# Patient Record
Sex: Female | Born: 1995 | Race: White | Hispanic: No | Marital: Single | Smoking: Never smoker
Health system: Southern US, Community
[De-identification: ages and names within clinical notes are randomized; demographics above are authoritative.]

## PROBLEM LIST (undated history)

## (undated) DIAGNOSIS — I493 Ventricular premature depolarization: Secondary | ICD-10-CM

## (undated) DIAGNOSIS — O139 Gestational [pregnancy-induced] hypertension without significant proteinuria, unspecified trimester: Secondary | ICD-10-CM

## (undated) HISTORY — DX: Gestational (pregnancy-induced) hypertension without significant proteinuria, unspecified trimester: O13.9

## (undated) HISTORY — DX: Ventricular premature depolarization: I49.3

---
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
## 2016-04-13 IMAGING — US US TRANSVAGINAL NON-OB
1 series · 13 of 15 positions shown · non-contrast
Comparison: None.

CLINICAL DATA: Pelvic pain. LMP [DATE]. Clinical suspicion for
ovarian torsion.

EXAM:
TRANSABDOMINAL AND TRANSVAGINAL ULTRASOUND OF PELVIS
DOPPLER ULTRASOUND OF OVARIES
TECHNIQUE: Both transabdominal and transvaginal ultrasound examinations of the
pelvis were performed. Transabdominal technique was performed for
global imaging of the pelvis including uterus, ovaries, adnexal
regions, and pelvic cul-de-sac.
It was necessary to proceed with endovaginal exam following the
transabdominal exam to visualize the endometrium and ovaries. Color
and duplex Doppler ultrasound was utilized to evaluate blood flow to
the ovaries.

[Series 1: us transvaginal non-ob · 0.11mm/px · 13 of 15 slices shown]
[im 1/15]
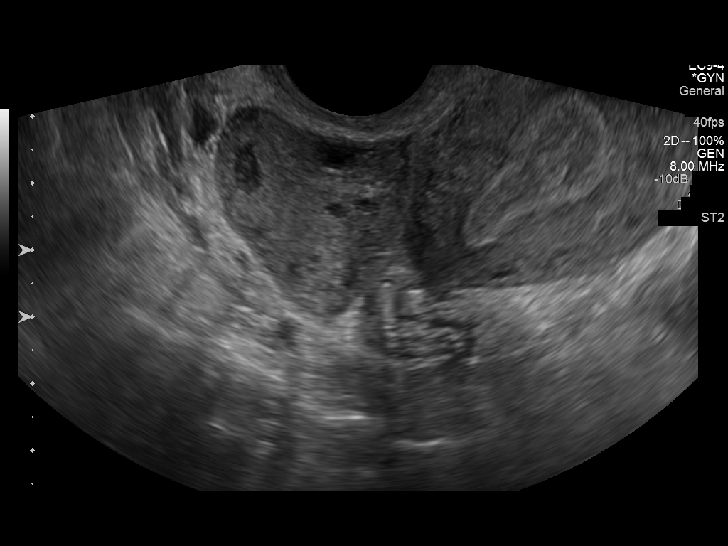
[im 2/15]
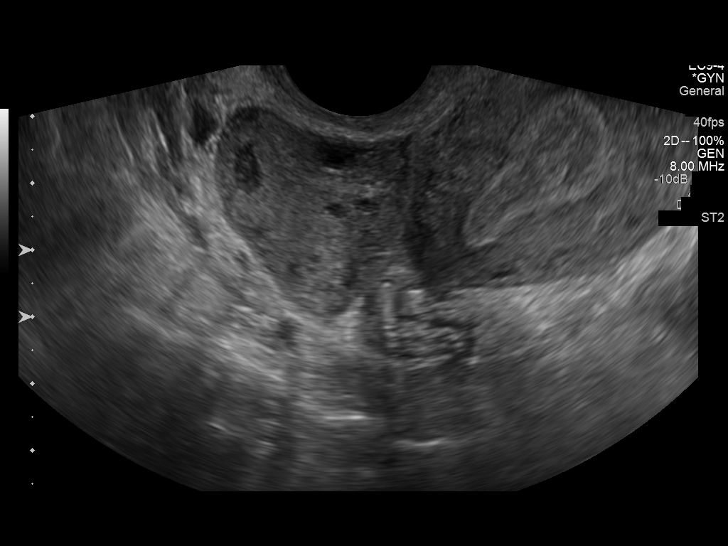
[im 3/15]
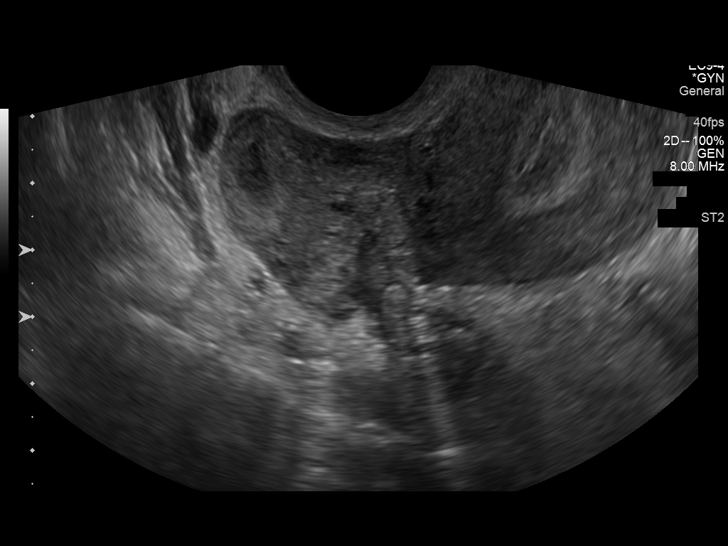
[im 5/15]
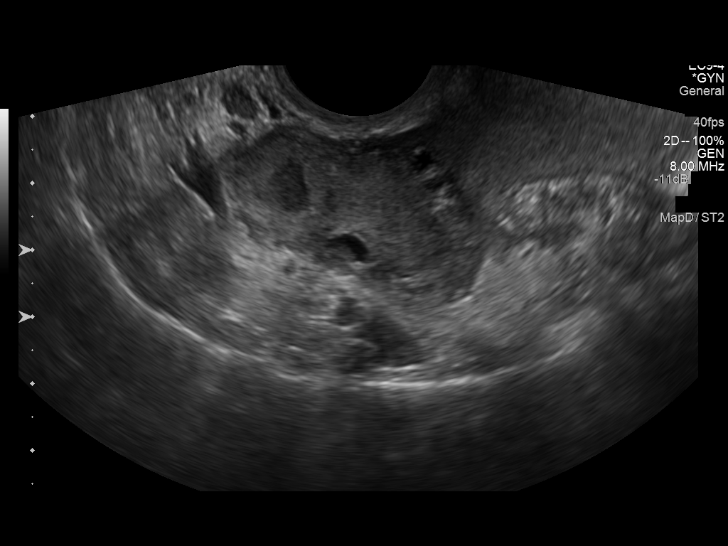
[im 6/15]
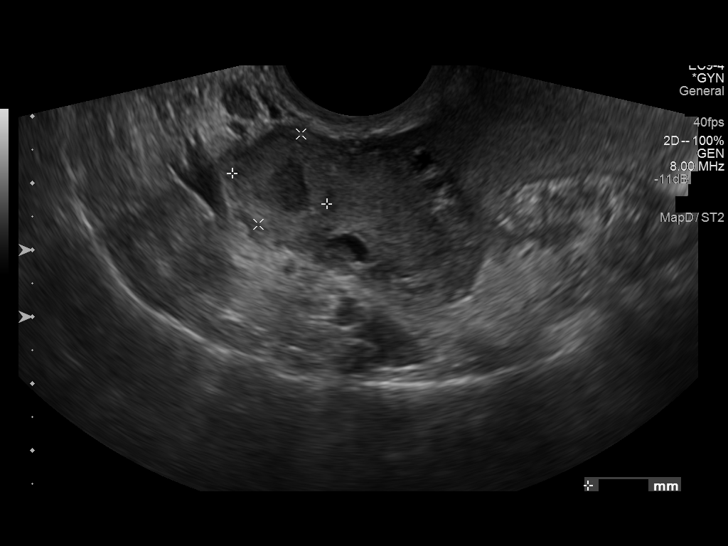
[im 7/15]
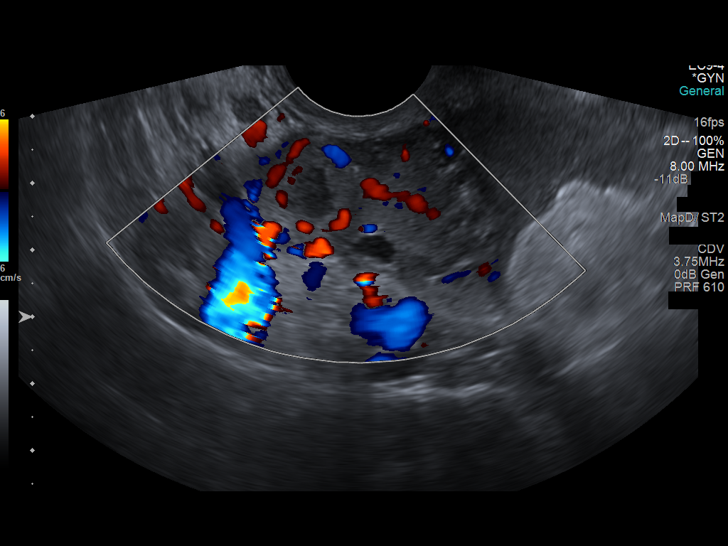
[im 8/15]
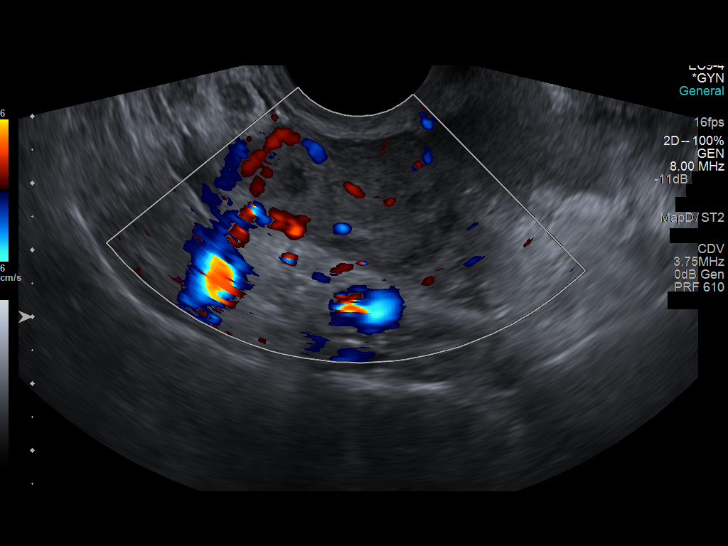
[im 9/15]
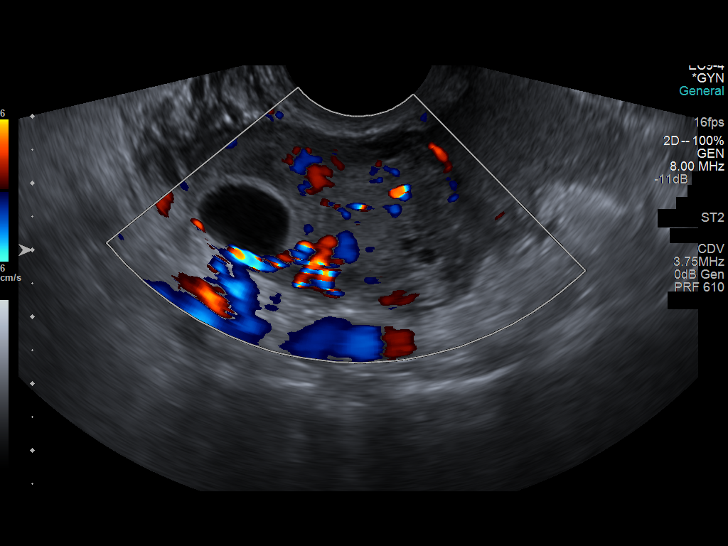
[im 10/15]
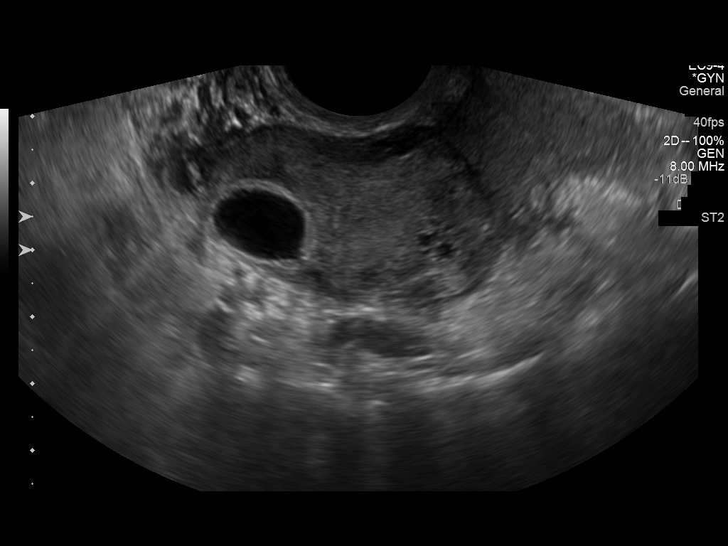
[im 11/15]
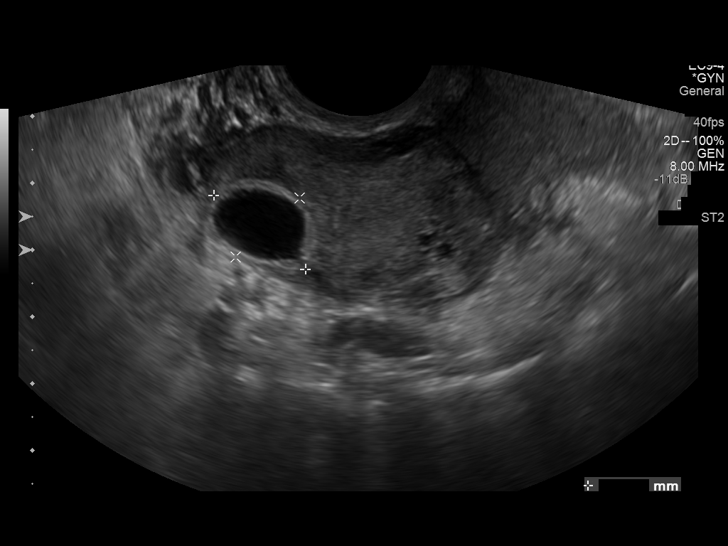
[im 13/15]
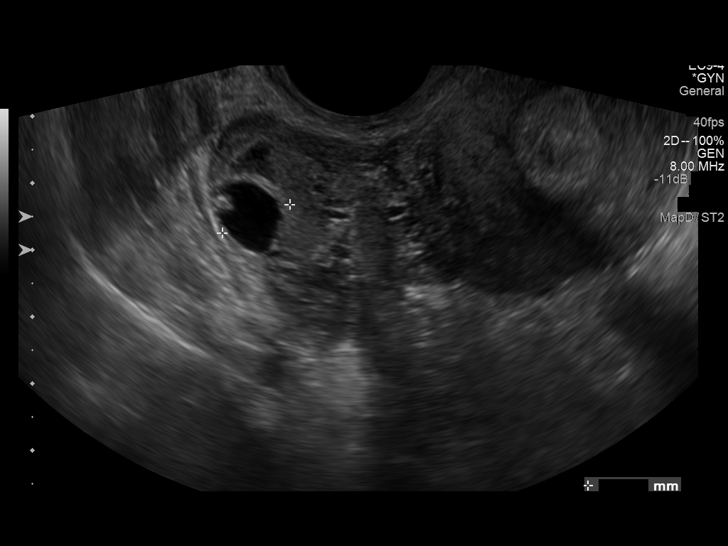
[im 14/15]
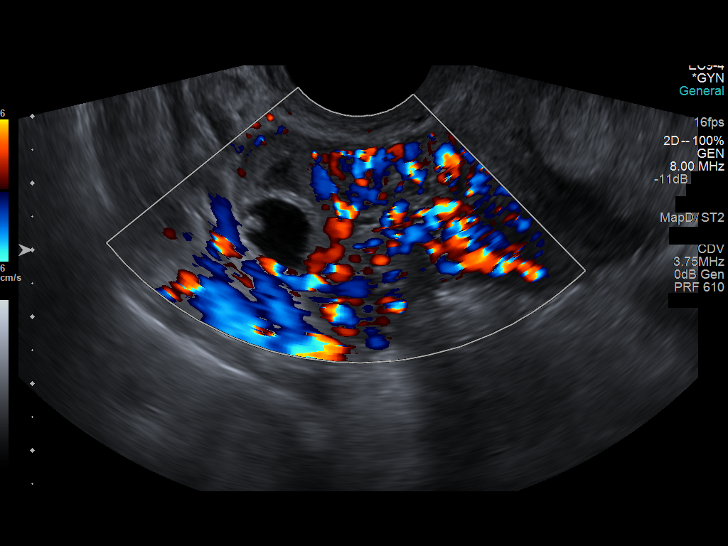
[im 15/15]
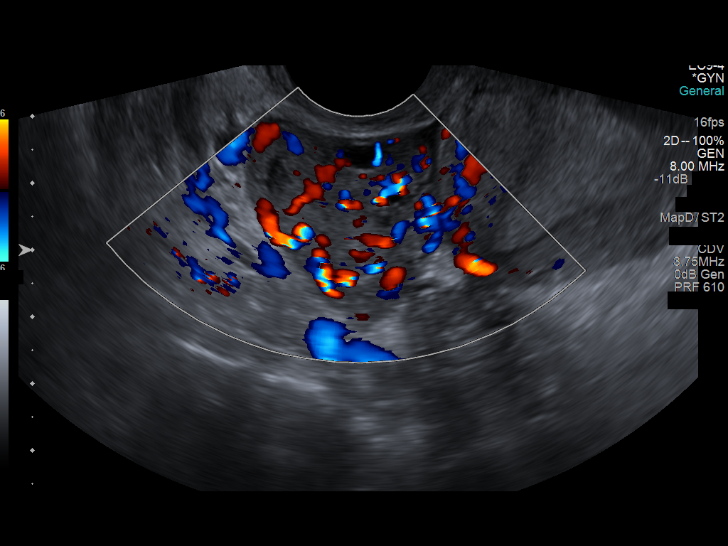

[13 of 15 positions shown; findings below may reference images not displayed]

FINDINGS: Uterus

Measurements: 6.8 x 3.8 x 4.8 cm. Retroflexed. No fibroids or other
mass visualized.

Endometrium

Thickness: 9 mm.  No focal abnormality visualized.

Right ovary

Measurements: 3.9 x 2.7 x 2.8 cm. Normal appearance/no adnexal mass.

Left ovary

Measurements: 2.5 x 2.0 x 2.0 cm. Normal appearance/no adnexal mass.

Pulsed Doppler evaluation of both ovaries demonstrates normal
low-resistance arterial and venous waveforms.

Other findings

Small amount of complex free fluid in pelvic cul-de-sac and both
adnexal regions.
IMPRESSION: Normal appearance of uterus and both ovaries. No pelvic mass
visualized.

Small amount of complex free fluid in pelvic cul-de-sac and both
adnexal regions, which is nonspecific.

No sonographic evidence for ovarian torsion.

## 2016-05-01 IMAGING — US US ABDOMEN COMPLETE
1 series · 14 of 25 positions shown · non-contrast
Comparison: 05/12/2013 CT.

CLINICAL DATA: Elevated liver function tests. Right-sided shoulder
pain. Appendectomy on 05/12/2013.

EXAM:
ULTRASOUND ABDOMEN COMPLETE

[Series 1: us abdomen complete · 0.43mm/px · 14 of 71 slices shown]
[im 1/71]
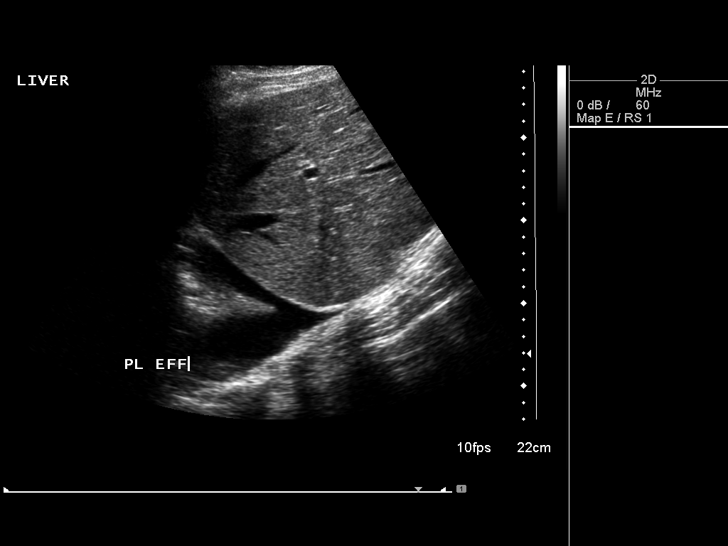
[im 6/71]
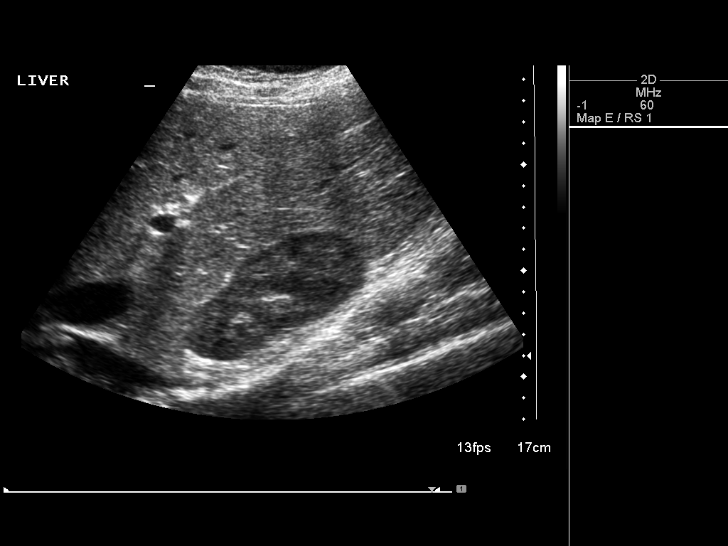
[im 12/71]
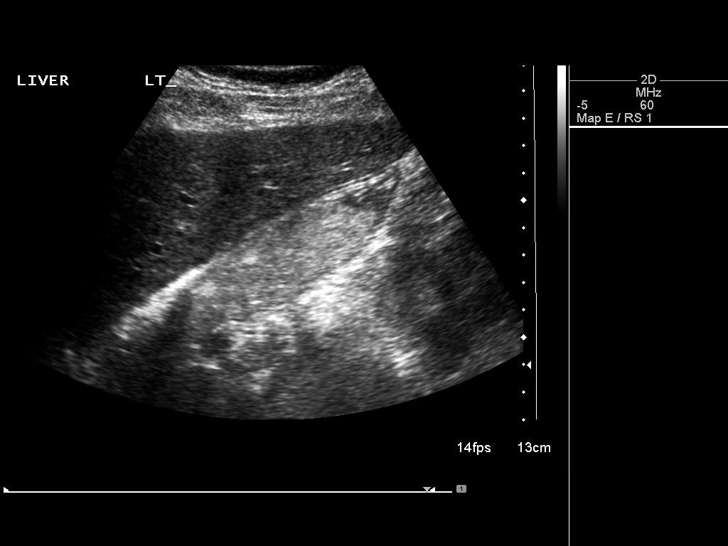
[im 18/71]
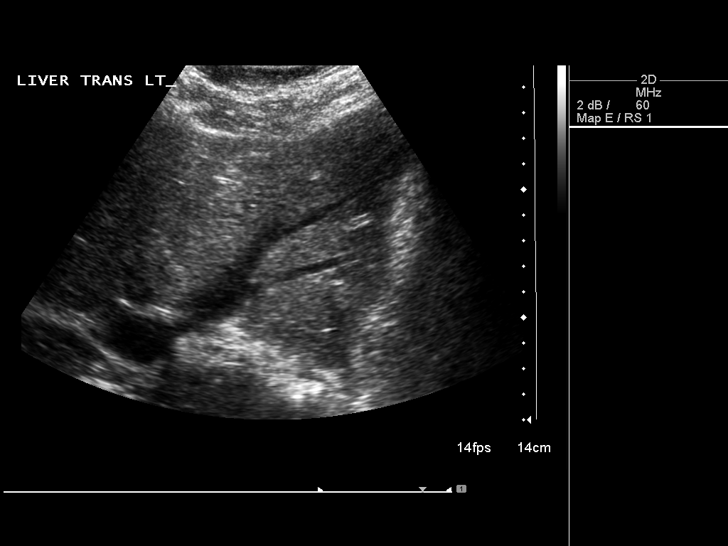
[im 24/71]
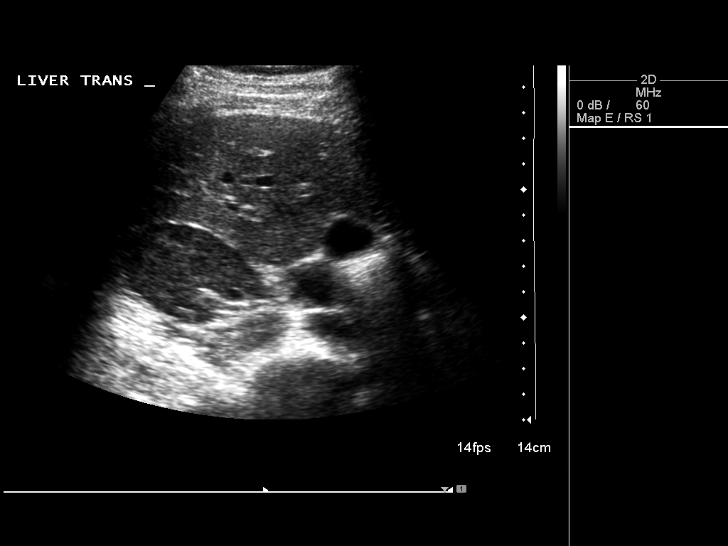
[im 27/71]
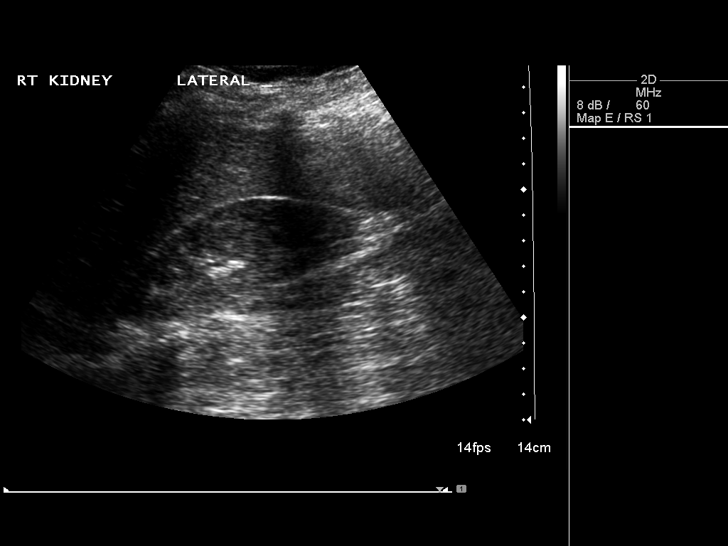
[im 33/71]
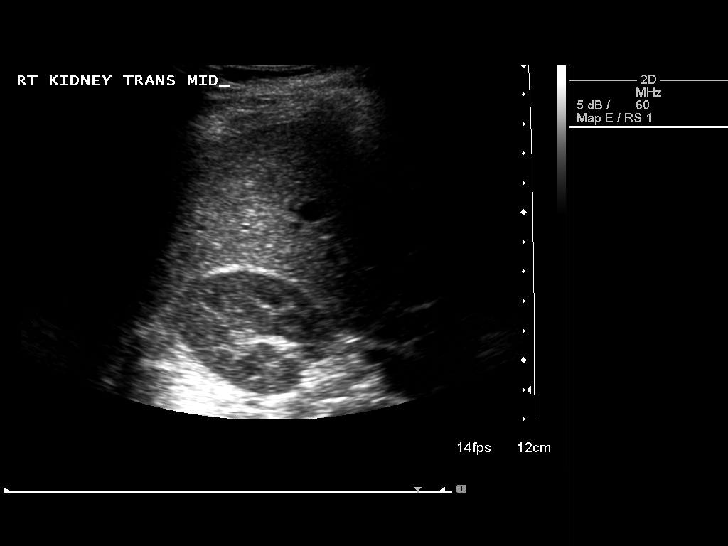
[im 38/71]
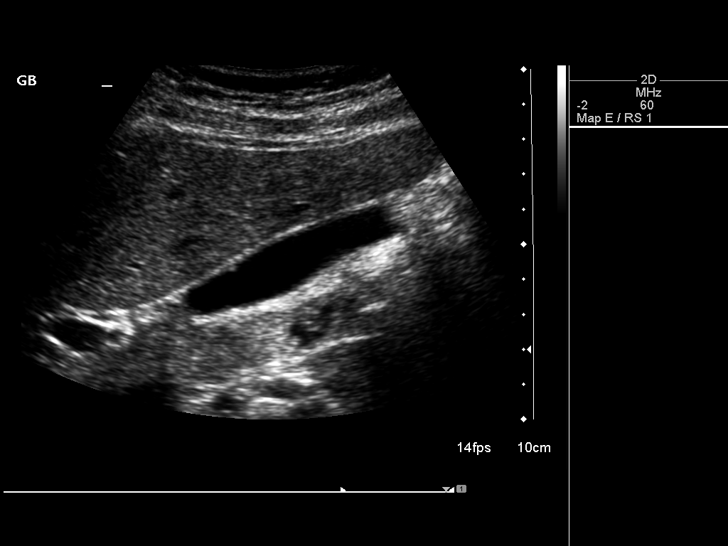
[im 44/71]
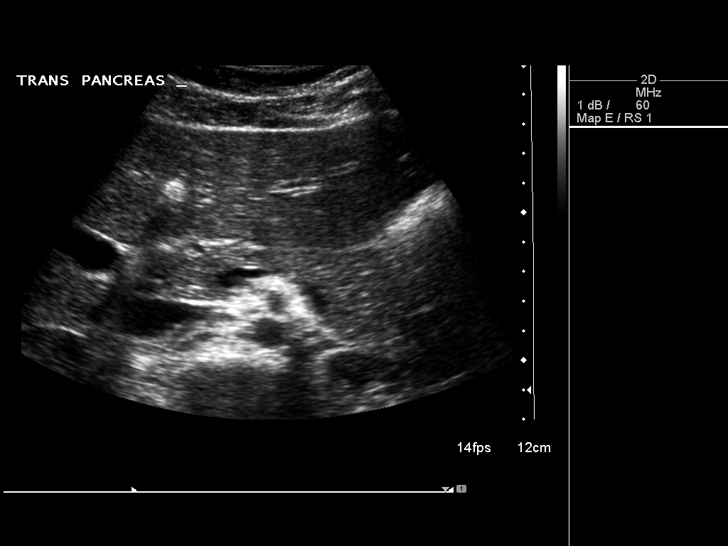
[im 47/71]
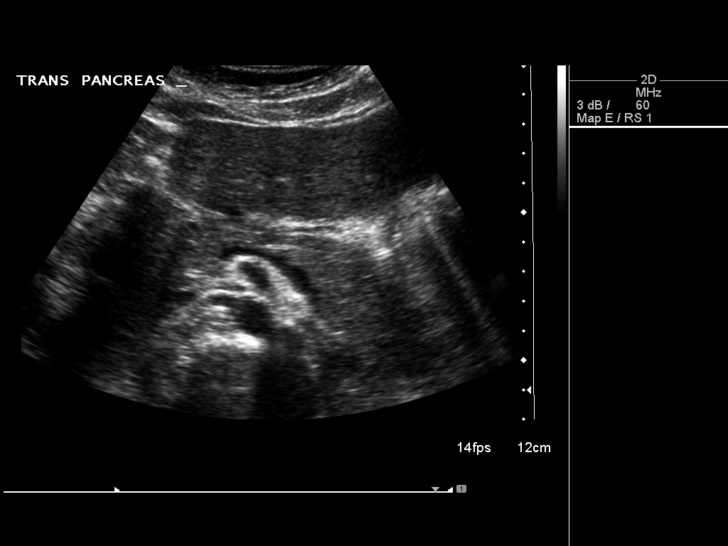
[im 53/71]
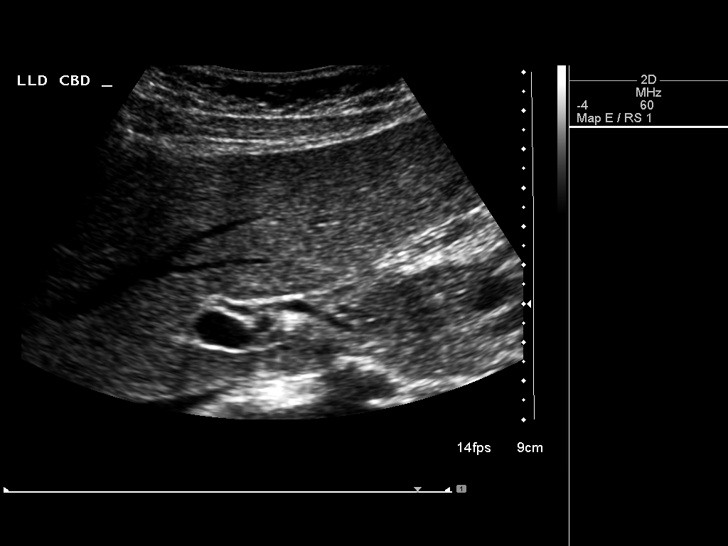
[im 59/71]
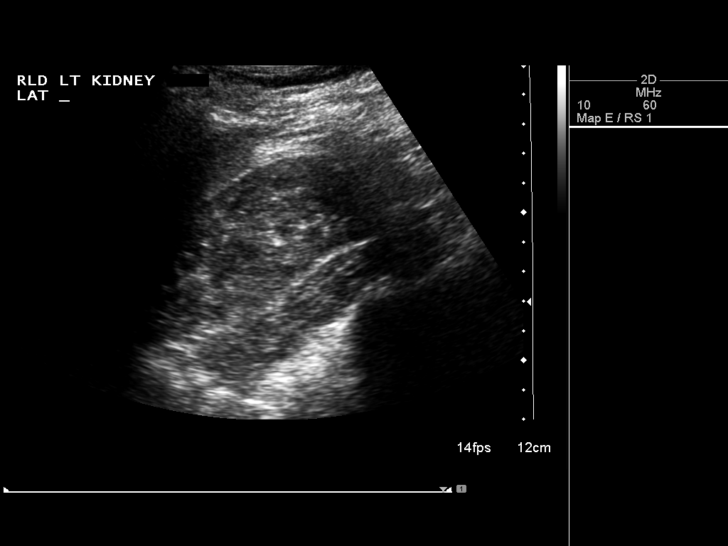
[im 65/71]
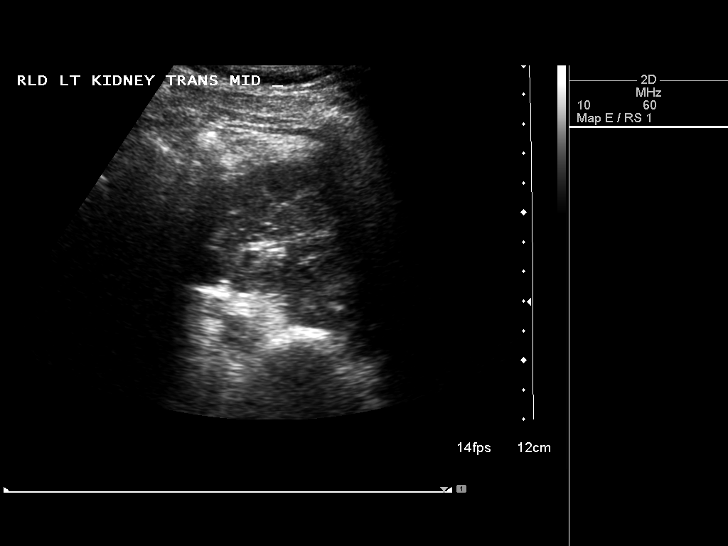
[im 71/71]
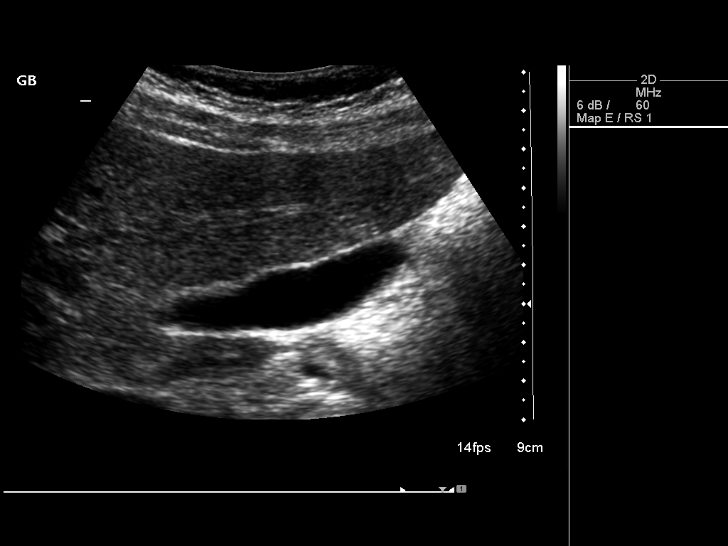

[14 of 25 positions shown; findings below may reference images not displayed]

FINDINGS: Gallbladder:

Possible 2 mm gallbladder polyp on image 39. Of doubtful clinical
significance. No wall thickening or pericholecystic fluid.
Sonographic Murphy's sign was not elicited.

Common bile duct:

Diameter: Normal, 2 mm.

Liver:

No focal lesion identified. Within normal limits in parenchymal
echogenicity.

IVC:

No abnormality visualized.

Pancreas:

Visualized portion unremarkable.

Spleen:

Size and appearance within normal limits.

Right Kidney:

Length: 10.8 cm. Echogenicity within normal limits. No mass or
hydronephrosis visualized.

Left Kidney:

Length: 11.4 cm. Echogenicity within normal limits. No mass or
hydronephrosis visualized.

Abdominal aorta:

No aneurysm visualized.

Other findings:

No ascites.  Small right-sided pleural effusion.
IMPRESSION: 1. No explanation for elevated liver function tests.
2. Small right-sided pleural effusion. Consider chest radiograph
correlation.

## 2022-01-05 NOTE — L&D Delivery Note (Signed)
DELIVERY NOTE  Pt complete and at +2 station with urge to push. Epidural controlling pain. Pt pushed and delivered a viable female infant in LOA position. Anterior and posterior shoulders spontaneously delivered with next two pushes; body easily followed next. Infant placed on mothers abdomen and bulb suction of mouth and nose performed. Cord was then clamped and cut by FOB. Cord blood obtained, 3VC. Baby had a vigorous spontaneous cry noted. Placenta then delivered at 2156 intact. Fundal massage performed and pitocin per protocol. Fundus firm. Large EBL initially, likely 2/2 magnesium sulfate running. TXA and methergine ordered (normotensive). The following lacerations were noted: 2nd deg, left sulcal, left periclitoral. Repaired in routine fashion with 2-0 vicryl. I stepped out of room after 2nd deg completed to complete c-section. Returned to complete sulcal and periclitoral repairs afterwards. Mother and baby stable. Counts correct EBL 800cc.  Infant time: 2154 Gender: female Placenta time: 2156 Apgars: 9/9 Weight: pending skin-to-skin

## 2022-02-19 LAB — OB RESULTS CONSOLE RUBELLA ANTIBODY, IGM: Rubella: NON-IMMUNE/NOT IMMUNE

## 2022-02-19 LAB — OB RESULTS CONSOLE HIV ANTIBODY (ROUTINE TESTING): HIV: NONREACTIVE

## 2022-02-19 LAB — OB RESULTS CONSOLE ANTIBODY SCREEN: Antibody Screen: NEGATIVE

## 2022-02-19 LAB — OB RESULTS CONSOLE RPR: RPR: NONREACTIVE

## 2022-02-19 LAB — OB RESULTS CONSOLE ABO/RH: RH Type: POSITIVE

## 2022-02-19 LAB — OB RESULTS CONSOLE GC/CHLAMYDIA
Chlamydia: NEGATIVE
Neisseria Gonorrhea: NEGATIVE

## 2022-02-19 LAB — OB RESULTS CONSOLE HEPATITIS B SURFACE ANTIGEN: Hepatitis B Surface Ag: NEGATIVE

## 2022-02-19 LAB — HEPATITIS C ANTIBODY: HCV Ab: NEGATIVE

## 2022-09-03 LAB — OB RESULTS CONSOLE GBS: GBS: NEGATIVE

## 2022-09-14 ENCOUNTER — Telehealth (HOSPITAL_COMMUNITY): Payer: Self-pay | Admitting: *Deleted

## 2022-09-14 ENCOUNTER — Encounter (HOSPITAL_COMMUNITY): Payer: Self-pay | Admitting: *Deleted

## 2022-09-14 NOTE — Telephone Encounter (Signed)
Preadmission screen  

## 2022-09-15 ENCOUNTER — Inpatient Hospital Stay (HOSPITAL_COMMUNITY): Payer: BC Managed Care – PPO | Admitting: Anesthesiology

## 2022-09-15 ENCOUNTER — Other Ambulatory Visit: Payer: Self-pay

## 2022-09-15 ENCOUNTER — Inpatient Hospital Stay (HOSPITAL_COMMUNITY)
Admission: AD | Admit: 2022-09-15 | Discharge: 2022-09-17 | DRG: 806 | Disposition: A | Discharge status: Home / Self Care | Payer: BC Managed Care – PPO | Attending: Obstetrics and Gynecology | Admitting: Obstetrics and Gynecology

## 2022-09-15 ENCOUNTER — Encounter (HOSPITAL_COMMUNITY): Payer: Self-pay | Admitting: Obstetrics and Gynecology

## 2022-09-15 ENCOUNTER — Inpatient Hospital Stay (HOSPITAL_COMMUNITY): Payer: BC Managed Care – PPO

## 2022-09-15 DIAGNOSIS — O1092 Unspecified pre-existing hypertension complicating childbirth: Secondary | ICD-10-CM | POA: Diagnosis present

## 2022-09-15 DIAGNOSIS — O99284 Endocrine, nutritional and metabolic diseases complicating childbirth: Secondary | ICD-10-CM | POA: Diagnosis present

## 2022-09-15 DIAGNOSIS — E876 Hypokalemia: Secondary | ICD-10-CM | POA: Diagnosis present

## 2022-09-15 DIAGNOSIS — Z3A38 38 weeks gestation of pregnancy: Secondary | ICD-10-CM | POA: Diagnosis not present

## 2022-09-15 DIAGNOSIS — O9081 Anemia of the puerperium: Secondary | ICD-10-CM | POA: Diagnosis not present

## 2022-09-15 DIAGNOSIS — D62 Acute posthemorrhagic anemia: Secondary | ICD-10-CM | POA: Diagnosis not present

## 2022-09-15 DIAGNOSIS — O114 Pre-existing hypertension with pre-eclampsia, complicating childbirth: Principal | ICD-10-CM | POA: Diagnosis present

## 2022-09-15 DIAGNOSIS — O169 Unspecified maternal hypertension, unspecified trimester: Principal | ICD-10-CM | POA: Diagnosis present

## 2022-09-15 LAB — TYPE AND SCREEN
ABO/RH(D): AB POS
Antibody Screen: NEGATIVE

## 2022-09-15 LAB — COMPREHENSIVE METABOLIC PANEL
ALT: 12 U/L (ref 0–44)
AST: 21 U/L (ref 15–41)
Albumin: 2.6 g/dL — ABNORMAL LOW (ref 3.5–5.0)
Alkaline Phosphatase: 168 U/L — ABNORMAL HIGH (ref 38–126)
Anion gap: 11 (ref 5–15)
BUN: 5 mg/dL — ABNORMAL LOW (ref 6–20)
CO2: 22 mmol/L (ref 22–32)
Calcium: 9 mg/dL (ref 8.9–10.3)
Chloride: 101 mmol/L (ref 98–111)
Creatinine, Ser: 0.51 mg/dL (ref 0.44–1.00)
GFR, Estimated: >60 mL/min (ref >60)
Glucose, Bld: 76 mg/dL (ref 70–99)
Potassium: 2.8 mmol/L — ABNORMAL LOW (ref 3.5–5.1)
Sodium: 134 mmol/L — ABNORMAL LOW (ref 135–145)
Total Bilirubin: 0.3 mg/dL (ref 0.3–1.2)
Total Protein: 6.1 g/dL — ABNORMAL LOW (ref 6.5–8.1)

## 2022-09-15 LAB — PROTEIN / CREATININE RATIO, URINE
Creatinine, Urine: 79 mg/dL
Protein Creatinine Ratio: 0.19 mg/mg{creat} — ABNORMAL HIGH (ref 0.00–0.15)
Total Protein, Urine: 15 mg/dL

## 2022-09-15 LAB — CBC
HCT: 34.2 % — ABNORMAL LOW (ref 36.0–46.0)
Hemoglobin: 11.4 g/dL — ABNORMAL LOW (ref 12.0–15.0)
MCH: 29.9 pg (ref 26.0–34.0)
MCHC: 33.3 g/dL (ref 30.0–36.0)
MCV: 89.8 fL (ref 80.0–100.0)
Platelets: 322 10*3/uL (ref 150–400)
RBC: 3.81 MIL/uL — ABNORMAL LOW (ref 3.87–5.11)
RDW: 14.2 % (ref 11.5–15.5)
WBC: 11.4 10*3/uL — ABNORMAL HIGH (ref 4.0–10.5)
nRBC: 0 % (ref 0.0–0.2)

## 2022-09-15 LAB — PLATELET COUNT: Platelets: 317 10*3/uL (ref 150–400)

## 2022-09-15 LAB — RPR: RPR Ser Ql: NONREACTIVE

## 2022-09-15 MED ORDER — OXYTOCIN-SODIUM CHLORIDE 30-0.9 UT/500ML-% IV SOLN
1.0000 m[IU]/min | INTRAVENOUS | Status: DC
  Administered 2022-09-15: 2 m[IU]/min via INTRAVENOUS

## 2022-09-15 MED ORDER — OXYCODONE-ACETAMINOPHEN 5-325 MG PO TABS: 2.0000 | ORAL_TABLET | ORAL | Status: DC | PRN

## 2022-09-15 MED ORDER — LABETALOL HCL 5 MG/ML IV SOLN
40.0000 mg | INTRAVENOUS | Status: DC | PRN
  Administered 2022-09-15: 40 mg via INTRAVENOUS
  Filled 2022-09-15: qty 8

## 2022-09-15 MED ORDER — LACTATED RINGERS IV SOLN: 500.0000 mL | INTRAVENOUS | Status: DC | PRN

## 2022-09-15 MED ORDER — MAGNESIUM SULFATE 40 GM/1000ML IV SOLN: 2.0000 g/h | INTRAVENOUS | Status: DC

## 2022-09-15 MED ORDER — EPHEDRINE 5 MG/ML INJ: 10.0000 mg | INTRAVENOUS | Status: DC | PRN

## 2022-09-15 MED ORDER — HYDRALAZINE HCL 20 MG/ML IJ SOLN: 10.0000 mg | INTRAMUSCULAR | Status: DC | PRN

## 2022-09-15 MED ORDER — LABETALOL HCL 5 MG/ML IV SOLN
20.0000 mg | INTRAVENOUS | Status: DC | PRN
  Administered 2022-09-15: 20 mg via INTRAVENOUS

## 2022-09-15 MED ORDER — MAGNESIUM SULFATE 40 GM/1000ML IV SOLN
INTRAVENOUS | Status: AC
  Filled 2022-09-15: qty 1000

## 2022-09-15 MED ORDER — LABETALOL HCL 5 MG/ML IV SOLN: 20.0000 mg | INTRAVENOUS | Status: DC | PRN

## 2022-09-15 MED ORDER — NIFEDIPINE ER OSMOTIC RELEASE 30 MG PO TB24
30.0000 mg | ORAL_TABLET | Freq: Every day | ORAL | Status: DC
  Administered 2022-09-15 – 2022-09-17 (×3): 30 mg via ORAL
  Filled 2022-09-15 (×3): qty 1

## 2022-09-15 MED ORDER — LABETALOL HCL 5 MG/ML IV SOLN: 80.0000 mg | INTRAVENOUS | Status: DC | PRN

## 2022-09-15 MED ORDER — OXYTOCIN BOLUS FROM INFUSION: 333.0000 mL | Freq: Once | INTRAVENOUS | Status: DC

## 2022-09-15 MED ORDER — LACTATED RINGERS IV SOLN: INTRAVENOUS | Status: DC

## 2022-09-15 MED ORDER — PHENYLEPHRINE 80 MCG/ML (10ML) SYRINGE FOR IV PUSH (FOR BLOOD PRESSURE SUPPORT): 80.0000 ug | PREFILLED_SYRINGE | INTRAVENOUS | Status: DC | PRN

## 2022-09-15 MED ORDER — METHYLERGONOVINE MALEATE 0.2 MG/ML IJ SOLN
INTRAMUSCULAR | Status: AC
  Administered 2022-09-15: 0.2 mg
  Filled 2022-09-15: qty 1

## 2022-09-15 MED ORDER — ONDANSETRON HCL 4 MG/2ML IJ SOLN
4.0000 mg | Freq: Four times a day (QID) | INTRAMUSCULAR | Status: DC | PRN
  Administered 2022-09-15: 4 mg via INTRAVENOUS
  Filled 2022-09-15: qty 2

## 2022-09-15 MED ORDER — LABETALOL HCL 5 MG/ML IV SOLN: 40.0000 mg | INTRAVENOUS | Status: DC | PRN

## 2022-09-15 MED ORDER — LABETALOL HCL 5 MG/ML IV SOLN
INTRAVENOUS | Status: AC
  Filled 2022-09-15: qty 4

## 2022-09-15 MED ORDER — SOD CITRATE-CITRIC ACID 500-334 MG/5ML PO SOLN: 30.0000 mL | ORAL | Status: DC | PRN

## 2022-09-15 MED ORDER — TRANEXAMIC ACID-NACL 1000-0.7 MG/100ML-% IV SOLN
INTRAVENOUS | Status: AC
  Administered 2022-09-15: 1000 mg
  Filled 2022-09-15: qty 100

## 2022-09-15 MED ORDER — LACTATED RINGERS IV SOLN
500.0000 mL | Freq: Once | INTRAVENOUS | Status: AC
  Administered 2022-09-15: 500 mL via INTRAVENOUS

## 2022-09-15 MED ORDER — DIPHENHYDRAMINE HCL 50 MG/ML IJ SOLN: 12.5000 mg | INTRAMUSCULAR | Status: DC | PRN

## 2022-09-15 MED ORDER — TERBUTALINE SULFATE 1 MG/ML IJ SOLN: 0.2500 mg | Freq: Once | INTRAMUSCULAR | Status: DC | PRN

## 2022-09-15 MED ORDER — MISOPROSTOL 50MCG HALF TABLET
50.0000 ug | ORAL_TABLET | ORAL | Status: DC | PRN
  Administered 2022-09-15: 50 ug via ORAL
  Filled 2022-09-15: qty 1

## 2022-09-15 MED ORDER — OXYTOCIN-SODIUM CHLORIDE 30-0.9 UT/500ML-% IV SOLN
2.5000 [IU]/h | INTRAVENOUS | Status: DC
  Filled 2022-09-15 (×2): qty 500

## 2022-09-15 MED ORDER — OXYCODONE-ACETAMINOPHEN 5-325 MG PO TABS: 1.0000 | ORAL_TABLET | ORAL | Status: DC | PRN

## 2022-09-15 MED ORDER — LIDOCAINE HCL (PF) 1 % IJ SOLN
INTRAMUSCULAR | Status: DC | PRN
  Administered 2022-09-15: 11 mL via EPIDURAL

## 2022-09-15 MED ORDER — LIDOCAINE HCL (PF) 1 % IJ SOLN
30.0000 mL | INTRAMUSCULAR | Status: AC | PRN
  Administered 2022-09-15: 30 mL via SUBCUTANEOUS
  Filled 2022-09-15: qty 30

## 2022-09-15 MED ORDER — ACETAMINOPHEN 325 MG PO TABS
650.0000 mg | ORAL_TABLET | ORAL | Status: DC | PRN
  Administered 2022-09-15: 650 mg via ORAL
  Filled 2022-09-15: qty 2

## 2022-09-15 MED ORDER — FENTANYL-BUPIVACAINE-NACL 0.5-0.125-0.9 MG/250ML-% EP SOLN
12.0000 mL/h | EPIDURAL | Status: DC | PRN
  Administered 2022-09-15: 12 mL/h via EPIDURAL
  Filled 2022-09-15: qty 250

## 2022-09-15 MED ORDER — MAGNESIUM SULFATE BOLUS VIA INFUSION
4.0000 g | Freq: Once | INTRAVENOUS | Status: AC
  Administered 2022-09-15: 4 g via INTRAVENOUS
  Filled 2022-09-15: qty 1000

## 2022-09-15 NOTE — Anesthesia Procedure Notes (Signed)
Epidural Patient location during procedure: OB Start time: 09/15/2022 12:24 PM End time: 09/15/2022 12:37 PM  Staffing Anesthesiologist: Lowella Curb, MD Performed: anesthesiologist   Preanesthetic Checklist Completed: patient identified, IV checked, site marked, risks and benefits discussed, surgical consent, monitors and equipment checked, pre-op evaluation and timeout performed  Epidural Patient position: sitting Prep: ChloraPrep Patient monitoring: heart rate, cardiac monitor, continuous pulse ox and blood pressure Approach: midline Location: L2-L3 Injection technique: LOR saline  Needle:  Needle type: Tuohy  Needle gauge: 17 G Needle length: 9 cm Needle insertion depth: 6 cm Catheter type: closed end flexible Catheter size: 20 Guage Catheter at skin depth: 11 cm Test dose: negative  Assessment Events: blood not aspirated, injection not painful, no injection resistance, no paresthesia and negative IV test  Additional Notes Reason for block:procedure for pain

## 2022-09-15 NOTE — Progress Notes (Signed)
Patient required IV labetalol 20mg  at 1253 for SRBP. Severe ranges have returned requiring IV labetalol again, therefore meeting requirements for ChTN with SI PreE (worsening BP). Being magnesium sulfate for seizure ppx (4g bolus, 2g maintenance). Denies PreE symptoms at this time BP (!) 149/76   Pulse 86   Temp 98.3 F (36.8 C) (Oral)   Resp 19   Ht 5\' 6"  (1.676 m)   Wt 113.9 kg   SpO2 95%   BMI 40.51 kg/m  CE 7/90/-1 TOCO q29min, pitocin at 87mU/min. Cat 1 tracing, some early decels  Continue magnesium sulfate and pitocin per protocol, anticipate SVD

## 2022-09-15 NOTE — H&P (Signed)
Joanna Weiss is a 27 y.o. female presenting for scheduled IOL for chronic hypertension. +F, denies VB, LOF, some cramping after cytotec.  PNC c/b 1) CHTN - elevated BP 1st and 3rd trimester. Labs WNL 9/5, uPC at that time 0.24. Weekly BPP for AFT GS on 8.22: EFW 7lbs 8oz, 90%, BPP 8/8, nl afi, post plac, vertex GBS neg OB History     Gravida  1   Para      Term      Preterm      AB      Living         SAB      IAB      Ectopic      Multiple      Live Births             Past Medical History:  Diagnosis Date   Pregnancy induced hypertension    PVC's (premature ventricular contractions)    Past Surgical History:  Procedure Laterality Date   LAPAROSCOPIC APPENDECTOMY N/A 05/13/2013   Procedure: APPENDECTOMY LAPAROSCOPIC;  Surgeon: Atilano Ina, MD;  Location: [facility] OR;  Service: General;  Laterality: N/A;   Family History: family history includes High blood pressure in her mother. Social History:  reports that she has never smoked. She does not have any smokeless tobacco history on file. She reports that she does not drink alcohol and does not use drugs.     Maternal Diabetes: No1hr elev, 3hr WNL Genetic Screening: Normal Maternal Ultrasounds/Referrals: Normal Fetal Ultrasounds or other Referrals:  None Maternal Substance Abuse:  No Significant Maternal Medications:  None Significant Maternal Lab Results:  Group B Strep negative Number of Prenatal Visits:greater than 3 verified prenatal visits Other Comments:  None  Review of Systems  Constitutional:  Negative for chills and fever.  Respiratory:  Negative for shortness of breath.   Cardiovascular:  Negative for chest pain, palpitations and leg swelling.  Gastrointestinal:  Negative for abdominal pain and vomiting.  Neurological:  Negative for dizziness, weakness and headaches.  Psychiatric/Behavioral:  Negative for suicidal ideas.    Maternal Medical History:  Contractions: Frequency: irregular.    Fetal activity: Perceived fetal activity is normal.   Prenatal complications: PIH.   No IUGR or placental abnormality.   Prenatal Complications - Diabetes: none.   Dilation: 4 Effacement (%): 50 Station: -3 Exam by:: Marquita C, RN Blood pressure (!) 155/92, pulse 91, temperature 98.3 F (36.8 C), temperature source Oral, resp. rate 19, height 5\' 6"  (1.676 m), weight 113.9 kg. Exam Physical Exam Constitutional:      General: She is not in acute distress.    Appearance: She is well-developed.  HENT:     Head: Normocephalic and atraumatic.  Eyes:     Pupils: Pupils are equal, round, and reactive to light.  Cardiovascular:     Rate and Rhythm: Normal rate and regular rhythm.     Heart sounds: No murmur heard.    No gallop.  Abdominal:     Tenderness: There is no abdominal tenderness. There is no guarding or rebound.  Genitourinary:    Vagina: Normal.     Uterus: Normal.   Musculoskeletal:        General: Normal range of motion.     Cervical back: Normal range of motion and neck supple.  Skin:    General: Skin is warm and dry.  Neurological:     Mental Status: She is alert and oriented to person, place, and time.  Prenatal labs: ABO, Rh: --/--/AB POS (09/10 0131) Antibody: NEG (09/10 0131) Rubella: Nonimmune (02/15 0000) RPR: Nonreactive (02/15 0000)  HBsAg: Negative (02/15 0000)  HIV: Non-reactive (02/15 0000)  GBS: Negative/-- (08/29 0000)   Cat 1 tracing - baseline 120 TOCO irr q2-53m Laps: K 2.8, Alk Phos 168, AST/ALT 21/12, 11.4/34.2/322  Assessment/Plan: This is a 27yo G1 @ 38 0/7 by LMP c/w 11wk TVUS presenting for IOL for CHTN not on meds. Asymptomatic at this time, Cat 1 tracing, s/p PV cytotec for ripening and now 4cm, proceed with pitocin per protocol. Considering nitrous vs epidural CHTN - elevated BP on admission, no persistent severe ranges. Procardia 30XL initiated, PreE labs grossly WNL, continue to monitor Hypokalemia - oral repletion  ordered    Carlisle Cater 09/15/2022, 7:29 AM

## 2022-09-15 NOTE — Anesthesia Preprocedure Evaluation (Signed)
Anesthesia Evaluation  Patient identified by MRN, date of birth, ID band Patient awake    Reviewed: Allergy & Precautions, H&P , NPO status , Patient's Chart, lab work & pertinent test results  Airway Mallampati: II  TM Distance: >3 FB Neck ROM: Full    Dental no notable dental hx.    Pulmonary neg pulmonary ROS   Pulmonary exam normal breath sounds clear to auscultation       Cardiovascular hypertension, negative cardio ROS Normal cardiovascular exam Rhythm:Regular Rate:Normal     Neuro/Psych negative neurological ROS  negative psych ROS   GI/Hepatic negative GI ROS, Neg liver ROS,,,  Endo/Other  negative endocrine ROS    Renal/GU negative Renal ROS  negative genitourinary   Musculoskeletal negative musculoskeletal ROS (+)    Abdominal  (+) + obese  Peds negative pediatric ROS (+)  Hematology negative hematology ROS (+)   Anesthesia Other Findings   Reproductive/Obstetrics (+) Pregnancy                             Anesthesia Physical Anesthesia Plan  ASA: 3  Anesthesia Plan: Epidural   Post-op Pain Management:    Induction:   PONV Risk Score and Plan:   Airway Management Planned:   Additional Equipment:   Intra-op Plan:   Post-operative Plan:   Informed Consent:   Plan Discussed with:   Anesthesia Plan Comments:        Anesthesia Quick Evaluation

## 2022-09-15 NOTE — Progress Notes (Signed)
Some worsening cramping, denies LOF, Vbm endorses +FM, Denies PreE symptoms  BP (!) 161/107   Pulse 86   Temp 98.3 F (36.8 C) (Oral)   Resp 19   Ht 5\' 6"  (1.676 m)   Wt 113.9 kg   BMI 40.51 kg/m  Clear AROM at 1000, CE 4-5/70/-2 Pitocin at 8mU/min, TOCO q30m  Last BP 161/107, repeat 140s/90s  Continue to titrate pitocin, epidural if desired.  CHTN on Med - since procardia 30XL, majority BP 140s/90s. Continue to monitor closely

## 2022-09-15 NOTE — Progress Notes (Signed)
Occ pelvic pressure per patient, back pain after epidural now improved. Denies PreE symptoms BP (!) 153/91   Pulse 95   Temp 98.3 F (36.8 C)   Resp 16   Ht 5\' 6"  (1.676 m)   Wt 113.9 kg   SpO2 95%   BMI 40.51 kg/m  CE complete/0 to +1 with good pushing Pitocin at 72mU/min, TOCO q44m.   Titrate pitocin, labor down 66m then begin pushing. Cat 1 tracing

## 2022-09-15 NOTE — Lactation Note (Signed)
This note was copied from a baby's chart. Lactation Consultation Note  Patient Name: Joanna Weiss Today's Date: 09/15/2022 Age:27 hours Reason for consult: L&D Initial assessment;1st time breastfeeding;Early term 37-38.6wks, See Birth Parent's-MR Birth Parent latched infant on her right breast using the football hold position, infant sustained latch and was still breastfeeding after 12 minutes when [facility] left the room. Birth Parent will continue to BF infant by cues, on demand, every 2-3 hours skin to skin. Birth Parent knows to call RN/LC for further latch assistance if needed.    Maternal Data    Feeding Mother's Current Feeding Choice: Breast Milk  LATCH Score Latch: Grasps breast easily, tongue down, lips flanged, rhythmical sucking.  Audible Swallowing: A few with stimulation  Type of Nipple: Everted at rest and after stimulation  Comfort (Breast/Nipple): Soft / non-tender  Hold (Positioning): Assistance needed to correctly position infant at breast and maintain latch.  LATCH Score: 8   Lactation Tools Discussed/Used    Interventions Interventions: Skin to skin;Assisted with latch;Breast compression;Adjust position;Support pillows;Education  Discharge    Consult Status Consult Status: Follow-up from L&D Date: 09/16/22 Follow-up type: In-patient    Frederico Hamman 09/15/2022, 11:11 PM

## 2022-09-16 LAB — CBC
HCT: 24.8 % — ABNORMAL LOW (ref 36.0–46.0)
Hemoglobin: 8.4 g/dL — ABNORMAL LOW (ref 12.0–15.0)
MCH: 30.9 pg (ref 26.0–34.0)
MCHC: 33.9 g/dL (ref 30.0–36.0)
MCV: 91.2 fL (ref 80.0–100.0)
Platelets: 284 10*3/uL (ref 150–400)
RBC: 2.72 MIL/uL — ABNORMAL LOW (ref 3.87–5.11)
RDW: 14.6 % (ref 11.5–15.5)
WBC: 19.9 10*3/uL — ABNORMAL HIGH (ref 4.0–10.5)
nRBC: 0 % (ref 0.0–0.2)

## 2022-09-16 MED ORDER — ACETAMINOPHEN 325 MG PO TABS
650.0000 mg | ORAL_TABLET | ORAL | Status: DC | PRN
  Administered 2022-09-16 (×3): 650 mg via ORAL
  Filled 2022-09-16 (×2): qty 2

## 2022-09-16 MED ORDER — ONDANSETRON HCL 4 MG/2ML IJ SOLN: 4.0000 mg | INTRAMUSCULAR | Status: DC | PRN

## 2022-09-16 MED ORDER — DIPHENHYDRAMINE HCL 25 MG PO CAPS: 25.0000 mg | ORAL_CAPSULE | Freq: Four times a day (QID) | ORAL | Status: DC | PRN

## 2022-09-16 MED ORDER — COCONUT OIL OIL: 1.0000 | TOPICAL_OIL | Status: DC | PRN

## 2022-09-16 MED ORDER — OXYCODONE HCL 5 MG PO TABS
5.0000 mg | ORAL_TABLET | Freq: Four times a day (QID) | ORAL | Status: DC | PRN
  Filled 2022-09-16: qty 1

## 2022-09-16 MED ORDER — PRENATAL MULTIVITAMIN CH
1.0000 | ORAL_TABLET | Freq: Every day | ORAL | Status: DC
  Administered 2022-09-16 – 2022-09-17 (×2): 1 via ORAL
  Filled 2022-09-16 (×2): qty 1

## 2022-09-16 MED ORDER — TETANUS-DIPHTH-ACELL PERTUSSIS 5-2.5-18.5 LF-MCG/0.5 IM SUSY: 0.5000 mL | PREFILLED_SYRINGE | Freq: Once | INTRAMUSCULAR | Status: DC

## 2022-09-16 MED ORDER — SENNOSIDES-DOCUSATE SODIUM 8.6-50 MG PO TABS
2.0000 | ORAL_TABLET | Freq: Every day | ORAL | Status: DC
  Administered 2022-09-16 – 2022-09-17 (×2): 2 via ORAL
  Filled 2022-09-16 (×3): qty 2

## 2022-09-16 MED ORDER — WITCH HAZEL-GLYCERIN EX PADS
1.0000 | MEDICATED_PAD | CUTANEOUS | Status: DC | PRN
  Administered 2022-09-16: 1 via TOPICAL

## 2022-09-16 MED ORDER — BENZOCAINE-MENTHOL 20-0.5 % EX AERO
1.0000 | INHALATION_SPRAY | CUTANEOUS | Status: DC | PRN
  Administered 2022-09-16: 1 via TOPICAL
  Filled 2022-09-16: qty 56

## 2022-09-16 MED ORDER — ONDANSETRON HCL 4 MG PO TABS: 4.0000 mg | ORAL_TABLET | ORAL | Status: DC | PRN

## 2022-09-16 MED ORDER — ZOLPIDEM TARTRATE 5 MG PO TABS: 5.0000 mg | ORAL_TABLET | Freq: Every evening | ORAL | Status: DC | PRN

## 2022-09-16 MED ORDER — SIMETHICONE 80 MG PO CHEW: 80.0000 mg | CHEWABLE_TABLET | ORAL | Status: DC | PRN

## 2022-09-16 MED ORDER — MAGNESIUM SULFATE 40 GM/1000ML IV SOLN
2.0000 g/h | INTRAVENOUS | Status: AC
  Administered 2022-09-16: 2 g/h via INTRAVENOUS
  Filled 2022-09-16: qty 1000

## 2022-09-16 MED ORDER — IBUPROFEN 600 MG PO TABS
600.0000 mg | ORAL_TABLET | Freq: Four times a day (QID) | ORAL | Status: DC
  Administered 2022-09-16 – 2022-09-17 (×7): 600 mg via ORAL
  Filled 2022-09-16 (×7): qty 1

## 2022-09-16 MED ORDER — DIBUCAINE (PERIANAL) 1 % EX OINT: 1.0000 | TOPICAL_OINTMENT | CUTANEOUS | Status: DC | PRN

## 2022-09-16 NOTE — Progress Notes (Signed)
PPD #1 No problems Afeb, VSS, BP normal Fundus firm, NT at U-1 ext-1+ edema, DTR 1/4  Hgb 11.4 to 8.4  Preeclampsia and CHTN-on magnesium until late tonight, BP stable on procardia xl 30 mg Acute blood loss amenia, monitor for symptoms

## 2022-09-16 NOTE — Anesthesia Postprocedure Evaluation (Signed)
Anesthesia Post Note  Patient: Joanna Weiss  Procedure(s) Performed: AN AD HOC LABOR EPIDURAL     Patient location during evaluation: OB High Risk Anesthesia Type: Epidural Level of consciousness: awake, oriented and awake and alert Pain management: pain level controlled Vital Signs Assessment: post-procedure vital signs reviewed and stable Respiratory status: spontaneous breathing, respiratory function stable and nonlabored ventilation Cardiovascular status: stable Postop Assessment: no headache, adequate PO intake, able to ambulate, patient able to bend at knees, no apparent nausea or vomiting and no backache Anesthetic complications: no   No notable events documented.  Last Vitals:  Vitals:   09/16/22 1300 09/16/22 1457  BP:    Pulse:    Resp: 18 16  Temp:    SpO2:      Last Pain:  Vitals:   09/16/22 1139  TempSrc: Oral  PainSc:    Pain Goal:                   Colene [place]

## 2022-09-17 MED ORDER — ACETAMINOPHEN 325 MG PO TABS: 650.0000 mg | ORAL_TABLET | Freq: Four times a day (QID) | ORAL | Status: AC | PRN

## 2022-09-17 MED ORDER — NIFEDIPINE ER 30 MG PO TB24: 30.0000 mg | ORAL_TABLET | Freq: Every day | ORAL | 11 refills | Status: AC

## 2022-09-17 MED ORDER — IBUPROFEN 200 MG PO TABS: 600.0000 mg | ORAL_TABLET | Freq: Four times a day (QID) | ORAL | Status: AC | PRN

## 2022-09-17 NOTE — Progress Notes (Signed)
Post Partum Day 2 Subjective: Patient is doing well this morning. Pain is controlled. Ambulating, voiding, tolerating PO. Minimal lochia. Breastfeeding.   Objective: Patient Vitals for the past 24 hrs:  BP Temp Temp src Pulse Resp SpO2  09/17/22 0811 (!) 124/58 98.3 F (36.8 C) Oral 88 18 98 %  09/17/22 0510 135/72 98 F (36.7 C) Oral (!) 103 18 97 %  09/16/22 2337 119/61 98 F (36.7 C) Oral 90 18 97 %  09/16/22 2300 -- -- -- -- 20 --  09/16/22 2200 -- -- -- -- 20 --  09/16/22 2100 -- -- -- -- 20 --  09/16/22 2028 128/68 98.2 F (36.8 C) Oral 93 20 99 %  09/16/22 1800 -- -- -- -- 18 --  09/16/22 1700 -- -- -- -- 17 --  09/16/22 1606 -- -- -- -- 16 --  09/16/22 1552 131/64 97.8 F (36.6 C) Oral 95 17 98 %  09/16/22 1457 -- -- -- -- 16 --  09/16/22 1300 -- -- -- -- 18 --  09/16/22 1200 -- -- -- -- 17 --  09/16/22 1139 (!) 116/47 98.4 F (36.9 C) Oral 98 18 98 %    Physical Exam:  General: alert Lochia: appropriate Uterine Fundus: firm DVT Evaluation: No evidence of DVT seen on physical exam.  Recent Labs    09/15/22 0119 09/15/22 1115 09/16/22 0505  WBC 11.4*  --  19.9*  HGB 11.4*  --  8.4*  HCT 34.2*  --  24.8*  PLT 322 317 284    Recent Labs    09/15/22 0119  NA 134*  K 2.8*  CL 101  BUN 5*  CREATININE 0.51  GLUCOSE 76  BILITOT 0.3  ALT 12  AST 21  ALKPHOS 168*  PROT 6.1*  ALBUMIN 2.6*    Recent Labs    09/15/22 0119  CALCIUM 9.0    No results for input(s): "PROTIME", "APTT", "INR" in the last 72 hours.  No results for input(s): "PROTIME", "APTT", "INR", "FIBRINOGEN" in the last 72 hours. Assessment/Plan: Joanna Weiss 27 y.o. G1P1001 PPD#2 sp SVD 1. PPC: routine PP care 2. CHTN with superimposed severe preeclampsia: s/p magnesium, blood pressures normal on Procardia XL 30mg  3. Rh pos 4. ABLA: asymptomatic, PO iron 5. Dispo: stable for discharge home, instructions reviewed    LOS: 2 days   Joanna Weiss 09/17/2022, 10:21  AM

## 2022-09-17 NOTE — Progress Notes (Signed)
Attempt to round, patient asleep. Vital signs reviewed. Will return.  Today's Vitals   09/16/22 2300 09/16/22 2337 09/17/22 0510 09/17/22 0811  BP:  119/61 135/72 (!) 124/58  Pulse:  90 (!) 103 88  Resp: 20 18 18 18   Temp:  98 F (36.7 C) 98 F (36.7 C) 98.3 F (36.8 C)  TempSrc:  Oral Oral Oral  SpO2:  97% 97% 98%  Weight:      Height:      PainSc:       Body mass index is 40.51 kg/m.  Alinda Deem MD 09/17/22 8:44 AM

## 2022-09-17 NOTE — Progress Notes (Signed)
Pt discharged home in stable condition after discharge instructions given. Pt verbalized understanding and all questions were answered. IV was discontinued and pt was sent home with all belongings.

## 2022-09-17 NOTE — Discharge Summary (Signed)
Postpartum Discharge Summary  Date of Service updated 09/17/22      Patient Name: Joanna Weiss DOB: September 05, 1995 MRN: [id number]  Date of admission: 09/15/2022 Delivery date:09/15/2022 Delivering provider: Carlisle Cater Date of discharge: 09/17/2022  Admitting diagnosis: Hypertension affecting pregnancy [O16.9] Intrauterine pregnancy: [redacted]w[redacted]d     Secondary diagnosis:  Principal Problem:   Hypertension affecting pregnancy  Additional problems: superimposed severe preeclampsia    Discharge diagnosis: Term Pregnancy Delivered and CHTN with superimposed preeclampsia                                              Post partum procedures: none Augmentation: AROM, Pitocin, and Cytotec Complications: None  Hospital course: Induction of Labor With Vaginal Delivery   27 y.o. yo G1P1001 at [redacted]w[redacted]d was admitted to the hospital 09/15/2022 for induction of labor.  Indication for induction:  chronic hypertension .  Patient had an labor course complicated bysevere range blood pressures requiring IV antihypertensives. Received IV magnesium.  Membrane Rupture Time/Date: 10:03 AM,09/15/2022  Delivery Method:Vaginal, Spontaneous Operative Delivery:N/A Episiotomy: None Lacerations:  2nd degree Details of delivery can be found in separate delivery note. Started on Procardia XL 30mg .  Patient had a postpartum course complicated by nothing. Patient is discharged home 09/17/22.  Newborn Data: Birth date:09/15/2022 Birth time:9:54 PM Gender:Female Living status:Living Apgars:9 ,9  Weight:3750 g  Magnesium Sulfate received: Yes: Seizure prophylaxis BMZ received: No Rhophylac:N/A MMR: ordered Transfusion:No  Physical exam  Vitals:   09/16/22 2300 09/16/22 2337 09/17/22 0510 09/17/22 0811  BP:  119/61 135/72 (!) 124/58  Pulse:  90 (!) 103 88  Resp: 20 18 18 18   Temp:  98 F (36.7 C) 98 F (36.7 C) 98.3 F (36.8 C)  TempSrc:  Oral Oral Oral  SpO2:  97% 97% 98%  Weight:      Height:        General: alert, cooperative, and no distress Lochia: appropriate Uterine Fundus: firm Incision: N/A DVT Evaluation: No evidence of DVT seen on physical exam. Labs: Lab Results  Component Value Date   WBC 19.9 (H) 09/16/2022   HGB 8.4 (L) 09/16/2022   HCT 24.8 (L) 09/16/2022   MCV 91.2 09/16/2022   PLT 284 09/16/2022      Latest Ref Rng & Units 09/15/2022    1:19 AM  CMP  Glucose 70 - 99 mg/dL 76   BUN 6 - 20 mg/dL 5   Creatinine 6.21 - 3.08 mg/dL 6.57   Sodium 846 - 962 mmol/L 134   Potassium 3.5 - 5.1 mmol/L 2.8   Chloride 98 - 111 mmol/L 101   CO2 22 - 32 mmol/L 22   Calcium 8.9 - 10.3 mg/dL 9.0   Total Protein 6.5 - 8.1 g/dL 6.1   Total Bilirubin 0.3 - 1.2 mg/dL 0.3   Alkaline Phos 38 - 126 U/L 168   AST 15 - 41 U/L 21   ALT 0 - 44 U/L 12    Edinburgh Score:     No data to display            After visit meds:  Allergies as of 09/17/2022   No Known Allergies      Medication List     STOP taking these medications    Junel FE 1.5/30 1.5-30 MG-MCG tablet Generic drug: norethindrone-ethinyl estradiol-iron  TAKE these medications    acetaminophen 325 MG tablet Commonly known as: Tylenol Take 2 tablets (650 mg total) by mouth every 6 (six) hours as needed (for pain scale < 4).   ibuprofen 200 MG tablet Commonly known as: ADVIL Take 3 tablets (600 mg total) by mouth every 6 (six) hours as needed. What changed:  medication strength how much to take how to take this when to take this reasons to take this   NIFEdipine 30 MG 24 hr tablet Commonly known as: ADALAT CC Take 1 tablet (30 mg total) by mouth daily.         Discharge home in stable condition Infant Feeding: Breast Infant Disposition:home with mother Discharge instruction: per After Visit Summary and Postpartum booklet. Activity: Advance as tolerated. Pelvic rest for 6 weeks.  Diet: iron rich diet Anticipated Birth Control: Unsure Postpartum Appointment:6  weeks Additional Postpartum F/U: BP check 1 week Future Appointments:No future appointments. Follow up Visit:  Follow-up Information     [facility]. Schedule an appointment as soon as possible for a visit.   Why: Blood pressure check in office next week Contact information: [address]  [address] [place] [address] [phone]                     09/17/2022 Charlett Nose, MD

## 2022-10-15 ENCOUNTER — Telehealth (HOSPITAL_COMMUNITY): Payer: Self-pay | Admitting: *Deleted

## 2022-10-15 NOTE — Telephone Encounter (Signed)
10/15/2022  Name: Joanna Weiss MRN: [id number] DOB: 09-Apr-1995  Reason for Call:  Transition of Care Hospital Discharge Call  Contact Status: Patient Contact Status: Complete  Language assistant needed: Interpreter Mode: Interpreter Not Needed        Follow-Up Questions: Do You Have Any Concerns About Your Health As You Heal From Delivery?: No Do You Have Any Concerns About Your Infants Health?: No  Edinburgh Postnatal Depression Scale:  In the Past 7 Days: I have been able to laugh and see the funny side of things.: As much as I always could I have looked forward with enjoyment to things.: As much as I ever did I have blamed myself unnecessarily when things went wrong.: Not very often I have been anxious or worried for no good reason.: Hardly ever I have felt scared or panicky for no good reason.: No, not at all Things have been getting on top of me.: No, I have been coping as well as ever I have been so unhappy that I have had difficulty sleeping.: Not at all I have felt sad or miserable.: No, not at all I have been so unhappy that I have been crying.: No, never The thought of harming myself has occurred to me.: Never Inocente Salles Postnatal Depression Scale Total: 2  PHQ2-9 Depression Scale:     Discharge Follow-up: Edinburgh score requires follow up?: No Patient was advised of the following resources:: Breastfeeding Support Group, Support Group (declines postpartum group information via email)  Post-discharge interventions: Reviewed Newborn Safe Sleep Practices  Salena Saner, RN 10/15/2022 11:38

## 2022-12-06 DIAGNOSIS — Z419 Encounter for procedure for purposes other than remedying health state, unspecified: Secondary | ICD-10-CM | POA: Diagnosis not present

## 2023-01-06 DIAGNOSIS — Z419 Encounter for procedure for purposes other than remedying health state, unspecified: Secondary | ICD-10-CM | POA: Diagnosis not present

## 2023-02-06 DIAGNOSIS — Z419 Encounter for procedure for purposes other than remedying health state, unspecified: Secondary | ICD-10-CM | POA: Diagnosis not present

## 2023-03-06 DIAGNOSIS — Z419 Encounter for procedure for purposes other than remedying health state, unspecified: Secondary | ICD-10-CM | POA: Diagnosis not present

## 2023-04-17 DIAGNOSIS — Z419 Encounter for procedure for purposes other than remedying health state, unspecified: Secondary | ICD-10-CM | POA: Diagnosis not present

## 2023-05-17 DIAGNOSIS — Z419 Encounter for procedure for purposes other than remedying health state, unspecified: Secondary | ICD-10-CM | POA: Diagnosis not present

## 2023-06-17 DIAGNOSIS — Z419 Encounter for procedure for purposes other than remedying health state, unspecified: Secondary | ICD-10-CM | POA: Diagnosis not present

## 2023-07-17 DIAGNOSIS — Z419 Encounter for procedure for purposes other than remedying health state, unspecified: Secondary | ICD-10-CM | POA: Diagnosis not present

## 2023-08-17 DIAGNOSIS — Z419 Encounter for procedure for purposes other than remedying health state, unspecified: Secondary | ICD-10-CM | POA: Diagnosis not present

## 2023-12-01 ENCOUNTER — Telehealth: Admitting: Physician Assistant

## 2023-12-01 DIAGNOSIS — N644 Mastodynia: Secondary | ICD-10-CM

## 2023-12-01 NOTE — Progress Notes (Signed)
  Because this is not something we can properly evaluate virtually and giving how long symptoms have been present, I feel your condition warrants further evaluation and I recommend that you be seen in a face-to-face visit.   NOTE: There will be NO CHARGE for this E-Visit   If you are having a true medical emergency, please call 911.     For an urgent face to face visit, [facility] has multiple urgent care centers for your convenience.  Click the link below for the full list of locations and hours, walk-in wait times, appointment scheduling options and driving directions:  Urgent Care - [place], [place], [place], [place], [place], [place]  [place]     Your MyChart E-visit questionnaire answers were reviewed by a board certified advanced clinical practitioner to complete your personal care plan based on your specific symptoms.    Thank you for using e-Visits.
